# Patient Record
Sex: Male | Born: 1985 | Race: White | Hispanic: No | Marital: Single | Smoking: Former smoker
Health system: Southern US, Community
[De-identification: ages and names within clinical notes are randomized; demographics above are authoritative.]

## PROBLEM LIST (undated history)

## (undated) DIAGNOSIS — I10 Essential (primary) hypertension: Secondary | ICD-10-CM

## (undated) HISTORY — DX: Essential (primary) hypertension: I10

## (undated) HISTORY — PX: TONSILLECTOMY: SUR1361

---

## 2015-11-16 ENCOUNTER — Encounter: Payer: Self-pay | Admitting: Family Medicine

## 2015-11-16 ENCOUNTER — Ambulatory Visit (INDEPENDENT_AMBULATORY_CARE_PROVIDER_SITE_OTHER): Payer: Worker's Compensation | Admitting: Family Medicine

## 2015-11-16 VITALS — BP 121/78 | HR 72 | Temp 98.1°F | Ht 68.0 in | Wt 262.4 lb

## 2015-11-16 DIAGNOSIS — S239XXA Sprain of unspecified parts of thorax, initial encounter: Secondary | ICD-10-CM | POA: Diagnosis not present

## 2015-11-16 MED ORDER — CYCLOBENZAPRINE HCL 10 MG PO TABS: 10.0000 mg | ORAL_TABLET | Freq: Three times a day (TID) | ORAL | Status: DC | PRN

## 2015-11-16 NOTE — Progress Notes (Signed)
BP 121/78 mmHg  Pulse 72  Temp(Src) 98.1 F (36.7 C) (Oral)  Ht  (1.727 m)  Wt 262 lb 6.4 oz (119.024 kg)  BMI 39.91 kg/m2   Subjective:    Patient ID: Christian Weaver, male    DOB: February 25, 1986, 30 y.o.   MRN: [id number]  HPI: Christian Weaver is a 30 y.o. male presenting on 11/16/2015 for Back Pain   HPI Back pain/Worker's Comp. Patient is coming in today for left-sided back pain/Worker's Comp. visit. He works for [employer] ([employer]). Yesterday he was pulling as a belt and the way he was pulling made him twist very quickly when released and now he is having left-sided back pain is extending down from just under his scapula to the lower back that side. He also has a little bit in the mid thoracic on the right side as well. He denies any tingling or numbness or weakness going down either side. He does have a lot of feelings of being uncomfortable and can't lay for prolonged periods and gets really stiff. He has been using anti-inflammatories and Tylenol to try and help. His also been icing to try and help as well. Is also been trying to do some stretching initially at home to try and help as well. The injury occurred on 11/15/2015.  Relevant past medical, surgical, family and social history reviewed and updated as indicated. Interim medical history since our last visit reviewed. Allergies and medications reviewed and updated.  Review of Systems  Constitutional: Negative for fever.  HENT: Negative for ear discharge and ear pain.   Eyes: Negative for discharge and visual disturbance.  Respiratory: Negative for shortness of breath and wheezing.   Cardiovascular: Negative for chest pain and leg swelling.  Gastrointestinal: Negative for abdominal pain, diarrhea and constipation.  Genitourinary: Negative for difficulty urinating.  Musculoskeletal: Positive for myalgias and back pain. Negative for gait problem.  Skin: Negative for rash.  Neurological: Negative for syncope, light-headedness and  headaches.  All other systems reviewed and are negative.   Per HPI unless specifically indicated above     Medication List       This list is accurate as of: 11/16/15  4:34 PM.  Always use your most recent med list.               acetaminophen 500 MG tablet  Commonly known as:  TYLENOL  Take 500 mg by mouth every 6 (six) hours as needed.     atenolol 25 MG tablet  Commonly known as:  TENORMIN  Take 25 mg by mouth daily.     cyclobenzaprine 10 MG tablet  Commonly known as:  FLEXERIL  Take 1 tablet (10 mg total) by mouth 3 (three) times daily as needed for muscle spasms.     naproxen sodium 220 MG tablet  Commonly known as:  ANAPROX  Take 220 mg by mouth as needed.           Objective:    BP 121/78 mmHg  Pulse 72  Temp(Src) 98.1 F (36.7 C) (Oral)  Ht  (1.727 m)  Wt 262 lb 6.4 oz (119.024 kg)  BMI 39.91 kg/m2  Wt Readings from Last 3 Encounters:  11/16/15 262 lb 6.4 oz (119.024 kg)    Physical Exam  Constitutional: He is oriented to person, place, and time. He appears well-developed and well-nourished. No distress.  Eyes: Conjunctivae and EOM are normal. Pupils are equal, round, and reactive to light. Right eye exhibits no discharge. No scleral  icterus.  Neck: Neck supple. No thyromegaly present.  Cardiovascular: Normal rate, regular rhythm, normal heart sounds and intact distal pulses.   No murmur heard. Pulmonary/Chest: Effort normal and breath sounds normal. No respiratory distress. He has no wheezes.  Musculoskeletal: Normal range of motion. He exhibits no edema.       Thoracic back: He exhibits tenderness. He exhibits normal range of motion, no bony tenderness, no swelling, no edema, no deformity and normal pulse.       Back:  Lymphadenopathy:    He has no cervical adenopathy.  Neurological: He is alert and oriented to person, place, and time. Coordination normal.  Skin: Skin is warm and dry. No rash noted. He is not diaphoretic.  Psychiatric: He  has a normal mood and affect. His behavior is normal.  Nursing note and vitals reviewed.   No results found for this or any previous visit.    Assessment & Plan:   Problem List Items Addressed This Visit    None    Visit Diagnoses    Back sprain, initial encounter    -  Primary    Left-sided back sprain while pulling something at work. Recommend heat, stretching, massage, muscle relaxers    Relevant Medications    cyclobenzaprine (FLEXERIL) 10 MG tablet        Follow up plan: Return if symptoms worsen or fail to improve.  Counseling provided for all of the vaccine components No orders of the defined types were placed in this encounter.    Arville CareJoshua Dettinger, MD [facility] 11/16/2015, 4:34 PM

## 2015-11-21 ENCOUNTER — Ambulatory Visit (INDEPENDENT_AMBULATORY_CARE_PROVIDER_SITE_OTHER): Payer: Worker's Compensation | Admitting: Family Medicine

## 2015-11-21 ENCOUNTER — Encounter: Payer: Self-pay | Admitting: Family Medicine

## 2015-11-21 ENCOUNTER — Telehealth: Payer: Self-pay

## 2015-11-21 ENCOUNTER — Ambulatory Visit (INDEPENDENT_AMBULATORY_CARE_PROVIDER_SITE_OTHER): Payer: Worker's Compensation

## 2015-11-21 VITALS — BP 127/56 | HR 63 | Temp 97.8°F | Ht 68.0 in | Wt 257.6 lb

## 2015-11-21 DIAGNOSIS — S239XXD Sprain of unspecified parts of thorax, subsequent encounter: Secondary | ICD-10-CM

## 2015-11-21 MED ORDER — TIZANIDINE HCL 4 MG PO CAPS: 4.0000 mg | ORAL_CAPSULE | Freq: Three times a day (TID) | ORAL | Status: DC

## 2015-11-21 MED ORDER — TRAMADOL HCL 50 MG PO TABS: 50.0000 mg | ORAL_TABLET | Freq: Three times a day (TID) | ORAL | Status: DC | PRN

## 2015-11-21 MED ORDER — METHYLPREDNISOLONE ACETATE 80 MG/ML IJ SUSP
80.0000 mg | Freq: Once | INTRAMUSCULAR | Status: AC
  Administered 2015-11-21: 80 mg via INTRAMUSCULAR

## 2015-11-21 NOTE — Telephone Encounter (Signed)
FYI, patient was prescribed Tizanidine capsules and they are $75 versus the Tizanidine tablets which are $14.   Patient does not have his workers [employer] insurance information so he is Cytogeneticistpaying cash.  [facility] to change from capsules to tablets.  I gave her authorization to do this and told her I would let you know.

## 2015-11-21 NOTE — Telephone Encounter (Signed)
Agree with Rx change.   Murtis SinkSam Romulus Hanrahan, MD [facility] 11/21/2015, 1:28 PM

## 2015-11-21 NOTE — Progress Notes (Addendum)
HPI  Patient presents today here for follow-up back pain, Worker's Compensation claim.  Patient was injured at work on June 15, last Thursday. He states that he had an anal whenever he pulled across his chest a heavy item, then he had to push it back when his right foot slipped causing a twisting motion of his back. He had immediate pain.  He went back to work the following day and had too much pain to continue. He was seen here and treated using NSAIDs and Flexeril. He states he's been using these medications, topical creams, ice, heat, and gentle stretching with not much improvement. He actually feels that he is getting a little bit worse.  He has been sent home due to appearing to be in a lot of pain.   PMH: Smoking status noted ROS: Per HPI  Objective: BP 127/56 mmHg  Pulse 63  Temp(Src) 97.8 F (36.6 C) (Oral)  Ht  (1.727 m)  Wt 257 lb 9.6 oz (116.847 kg)  BMI 39.18 kg/m2 Gen: NAD, alert, cooperative with exam HEENT: NCAT CV: RRR, good S1/S2, no murmur Resp: CTABL, no wheezes, non-labored Ext: No edema, warm Neuro: Alert and oriented, No gross deficits  Plain film of the thoracic and lumbar spine WNL   Patient smells like Biofreeze  MSK: Left-sided paraspinal muscle tenderness from his scapula to the left buttock, no midline tenderness Range of motion is preserved, has normal flexion, extension and rotation to either side  Assessment and plan:  # Back sprain, muscle spasm Consistent with back sprain Given significant symptoms that are severely limiting his function I have done some x-rays to rule out any bony abnormality. Given IM Depo-Medrol today, not getting much relief with NSAIDs Continue NSAIDs as needed, reviewed appropriate dosing of Aleve. Change muscle relaxer from Flexeril to tizanidine, too much sedation with Flexeril Given some limitations at work including no more than 5 pounds lifting or pulling, no bending Small amount of tramadol for  uncontrolled pain Physical therapy Return to clinic in 1 week    Orders Placed This Encounter  Procedures  . DG Thoracic Spine 2 View    Standing Status: Future     Number of Occurrences:      Standing Expiration Date: 01/20/2017    Order Specific Question:  Reason for Exam (SYMPTOM  OR DIAGNOSIS REQUIRED)    Answer:  back pain    Order Specific Question:  Preferred imaging location?    Answer:  Internal  . DG Lumbar Spine 2-3 Views    Standing Status: Future     Number of Occurrences:      Standing Expiration Date: 11/20/2016    Order Specific Question:  Reason for Exam (SYMPTOM  OR DIAGNOSIS REQUIRED)    Answer:  back pain    Order Specific Question:  Preferred imaging location?    Answer:  Internal  . Ambulatory referral to Physical Therapy    Referral Priority:  Routine    Referral Type:  Physical Medicine    Referral Reason:  Specialty Services Required    Requested Specialty:  Physical Therapy    Number of Visits Requested:  1    Meds ordered this encounter  Medications  . tizanidine (ZANAFLEX) 4 MG capsule    Sig: Take 1 capsule (4 mg total) by mouth 3 (three) times daily.    Dispense:  30 capsule    Refill:  0  . traMADol (ULTRAM) 50 MG tablet    Sig: Take 1 tablet (  50 mg total) by mouth every 8 (eight) hours as needed.    Dispense:  30 tablet    Refill:  0    Christian SinkSam Kentley Blyden, MD [facility] 11/21/2015, 11:30 AM

## 2015-11-21 NOTE — Patient Instructions (Signed)
Great to see you!  I have sent tizanidine, a muscle relaxer for your muscle spasm  You can try tramadol for the worse pain, continue aleve as needed. Do not drive or work after taking tramadol  You can contact physical therapy to schedule an appointment right away

## 2015-11-30 ENCOUNTER — Ambulatory Visit (INDEPENDENT_AMBULATORY_CARE_PROVIDER_SITE_OTHER): Payer: Worker's Compensation | Admitting: Family Medicine

## 2015-11-30 ENCOUNTER — Encounter: Payer: Self-pay | Admitting: Family Medicine

## 2015-11-30 VITALS — BP 138/78 | HR 78 | Temp 97.9°F | Ht 68.0 in | Wt 264.6 lb

## 2015-11-30 DIAGNOSIS — S39012D Strain of muscle, fascia and tendon of lower back, subsequent encounter: Secondary | ICD-10-CM

## 2015-11-30 NOTE — Progress Notes (Signed)
   HPI  Patient presents today here for follow-up of back strain, Worker's Compensation  Patient states that her work she's been avoiding lifting and pulling but otherwise working light normal. He feels ready to go back to work.  His back pain is improved drastically but not completely better.  He is taking Flexeril at night as needed tizanidine during the day. Tramadol did not help.  He denies any leg weakness, numbness, tingling. He also denies any radiation to the legs.   PMH: Smoking status noted ROS: Per HPI  Objective: BP 138/78 mmHg  Pulse 78  Temp(Src) 97.9 F (36.6 C) (Oral)  Ht 5\' 8"  (1.727 m)  Wt 264 lb 9.6 oz (120.022 kg)  BMI 40.24 kg/m2 Gen: NAD, alert, cooperative with exam HEENT: NCAT CV: RRR, good S1/S2, no murmur Resp: CTABL, no wheezes, non-labored Ext: No edema, warm Neuro: Alert and oriented, No gross deficits  Musculoskeletal Tenderness to palpation of lumbar spine, and paraspinal muscles on both sides Full range of motion with back  Assessment and plan:  # Back strain Improving, patient feels ready to get back to work now without any restrictions, he does have some residual pain. He is out of work next week for the holiday. Return to clinic with any concerns    Murtis SinkSam Bradshaw, MD [facility] 11/30/2015, 4:18 PM

## 2015-12-16 ENCOUNTER — Encounter (HOSPITAL_COMMUNITY): Payer: Self-pay | Admitting: Emergency Medicine

## 2015-12-16 ENCOUNTER — Emergency Department (HOSPITAL_COMMUNITY)
Admission: EM | Admit: 2015-12-16 | Discharge: 2015-12-16 | Disposition: A | Discharge status: Home / Self Care | Payer: BLUE CROSS/BLUE SHIELD | Attending: Emergency Medicine | Admitting: Emergency Medicine

## 2015-12-16 ENCOUNTER — Emergency Department (HOSPITAL_COMMUNITY): Payer: BLUE CROSS/BLUE SHIELD

## 2015-12-16 DIAGNOSIS — Y929 Unspecified place or not applicable: Secondary | ICD-10-CM | POA: Diagnosis not present

## 2015-12-16 DIAGNOSIS — Z87891 Personal history of nicotine dependence: Secondary | ICD-10-CM | POA: Diagnosis not present

## 2015-12-16 DIAGNOSIS — W010XXA Fall on same level from slipping, tripping and stumbling without subsequent striking against object, initial encounter: Secondary | ICD-10-CM | POA: Diagnosis not present

## 2015-12-16 DIAGNOSIS — Y939 Activity, unspecified: Secondary | ICD-10-CM | POA: Diagnosis not present

## 2015-12-16 DIAGNOSIS — S62308A Unspecified fracture of other metacarpal bone, initial encounter for closed fracture: Secondary | ICD-10-CM

## 2015-12-16 DIAGNOSIS — S62322A Displaced fracture of shaft of third metacarpal bone, right hand, initial encounter for closed fracture: Secondary | ICD-10-CM | POA: Insufficient documentation

## 2015-12-16 DIAGNOSIS — I10 Essential (primary) hypertension: Secondary | ICD-10-CM | POA: Diagnosis not present

## 2015-12-16 DIAGNOSIS — Y999 Unspecified external cause status: Secondary | ICD-10-CM | POA: Diagnosis not present

## 2015-12-16 DIAGNOSIS — S6991XA Unspecified injury of right wrist, hand and finger(s), initial encounter: Secondary | ICD-10-CM | POA: Diagnosis present

## 2015-12-16 DIAGNOSIS — Z79899 Other long term (current) drug therapy: Secondary | ICD-10-CM | POA: Insufficient documentation

## 2015-12-16 MED ORDER — NAPROXEN 250 MG PO TABS: 250.0000 mg | ORAL_TABLET | Freq: Two times a day (BID) | ORAL | Status: AC | PRN

## 2015-12-16 NOTE — Discharge Instructions (Signed)
Take the prescription as directed.  Call the Orthopedic doctor on Monday to schedule a follow up appointment this week.  Return to the Emergency Department immediately sooner if worsening.

## 2015-12-16 NOTE — ED Provider Notes (Signed)
CSN: [id number]     Arrival date & time 12/16/15  4098 History   First MD Initiated Contact with Patient 12/16/15 [phone]     Chief Complaint  Patient presents with  . Hand Injury      HPI Pt was seen at 0720. Per pt, c/o sudden onset and resolution of one episode of fall that occurred approximately PTA. Pt states he tripped and fell, landing on his right hand. Pt c/o right hand and wrist "pain." Pain worsens with palpation of the area and movement. Denies any other injuries. Denies focal motor weakness, no tingling/numbness in extremities.    Past Medical History  Diagnosis Date  . Hypertension    Past Surgical History  Procedure Laterality Date  . Tonsillectomy     Family History  Problem Relation Age of Onset  . Drug abuse Paternal Aunt    Social History  Substance Use Topics  . Smoking status: Former Smoker    Quit date: 06/02/2014  . Smokeless tobacco: None  . Alcohol Use: Yes     Comment: occasional    Review of Systems ROS: Statement: All systems negative except as marked or noted in the HPI; Constitutional: Negative for fever and chills. ; ; Eyes: Negative for eye pain, redness and discharge. ; ; ENMT: Negative for ear pain, hoarseness, nasal congestion, sinus pressure and sore throat. ; ; Cardiovascular: Negative for chest pain, palpitations, diaphoresis, dyspnea and peripheral edema. ; ; Respiratory: Negative for cough, wheezing and stridor. ; ; Gastrointestinal: Negative for nausea, vomiting, diarrhea, abdominal pain, blood in stool, hematemesis, jaundice and rectal bleeding. . ; ; Genitourinary: Negative for dysuria, flank pain and hematuria. ; ; Musculoskeletal: Negative for back pain and neck pain. +right hand and wrist pain, swelling and trauma.; ; Skin: Negative for pruritus, rash, abrasions, blisters, bruising and skin lesion.; ; Neuro: Negative for headache, lightheadedness and neck stiffness. Negative for weakness, altered level of consciousness, altered mental  status, extremity weakness, paresthesias, involuntary movement, seizure and syncope.      Allergies  Review of patient's allergies indicates no known allergies.  Home Medications   Prior to Admission medications   Medication Sig Start Date End Date Taking? Authorizing Provider  acetaminophen (TYLENOL) 500 MG tablet Take 500 mg by mouth every 6 (six) hours as needed.    Historical Provider, MD  atenolol (TENORMIN) 25 MG tablet Take 25 mg by mouth daily.    Historical Provider, MD  naproxen sodium (ANAPROX) 220 MG tablet Take 220 mg by mouth as needed.    Historical Provider, MD  tizanidine (ZANAFLEX) 4 MG capsule Take 1 capsule (4 mg total) by mouth 3 (three) times daily. 11/21/15   Elenora Gamma, MD  traMADol (ULTRAM) 50 MG tablet Take 1 tablet (50 mg total) by mouth every 8 (eight) hours as needed. 11/21/15   Elenora Gamma, MD   BP 146/101 mmHg  Pulse 117  Temp(Src) 98.2 F (36.8 C) (Oral)  Resp 20  Ht  (1.727 m)  Wt 248 lb (112.492 kg)  BMI 37.72 kg/m2  SpO2 94% Physical Exam  0725: Physical examination:  Nursing notes reviewed; Vital signs and O2 SAT reviewed;  Constitutional: Well developed, Well nourished, Well hydrated, In no acute distress; Head:  Normocephalic, atraumatic; Eyes: EOMI, PERRL, No scleral icterus; ENMT: Mouth and pharynx normal, Mucous membranes moist; Neck: Supple, Full range of motion; Cardiovascular: Regular rate and rhythm, No gallop; Respiratory: Breath sounds clear & equal bilaterally, No wheezes. Speaking full sentences with  ease, Normal respiratory effort/excursion; Chest: Nontender, Movement normal; Abdomen: Nondistended; Extremities: Pulses normal, NT right shoulder/elbow/finger. +generalized TTP right wrist, no edema, no deformity, no ecchymosis  +TTP right metacarpals 2-5 with localized edema, ecchymosis, no open wounds, no abrasions, no deformity. Right forearm compartments soft, strong radial pp, brisk cap refill in fingers. Hand NMS intact.  .; Neuro: AA&Ox3, Major CN grossly intact.  Speech clear. No gross focal motor or sensory deficits in extremities. Climbs on and off stretcher easily by himself. Gait steady.; Skin: Color normal, Warm, Dry.   ED Course  Procedures (including critical care time) Labs Review  Imaging Review  I have personally reviewed and evaluated these images and lab results as part of my medical decision-making.   EKG Interpretation None      MDM  MDM Reviewed: previous chart, nursing note and vitals Interpretation: x-ray     Dg Wrist Complete Right 12/16/2015  CLINICAL DATA:  Right wrist pain after fall today. Initial encounter. EXAM: RIGHT WRIST - COMPLETE 3+ VIEW COMPARISON:  None. FINDINGS: Mildly displaced fracture is seen involving the distal portion of the third metacarpal. This appears to be closed and posttraumatic. No other fracture or bony abnormality is noted. Joint spaces are intact. No soft tissue abnormality is noted. IMPRESSION: Mildly displaced distal third metacarpal fracture. Electronically Signed   By: Lupita RaiderJames  Green Jr, M.D.   On: 12/16/2015 07:50   Dg Hand Complete Right 12/16/2015  CLINICAL DATA:  Injury.  Pain and swelling EXAM: RIGHT HAND - COMPLETE 3+ VIEW COMPARISON:  None. FINDINGS: There is an acute fracture deformity involving the distal shaft of the third metacarpal bone. There is mild volar angulation of the distal fracture fragments. Diffuse soft tissue swelling is identified. IMPRESSION: 1. Acute fracture involves the distal shaft of the third metacarpal bone. Electronically Signed   By: Signa Kellaylor  Stroud M.D.   On: 12/16/2015 07:26    0810:  XR as above. Splint, f/u Ortho MD. Dx and testing d/w pt.  Questions answered.  Verb understanding, agreeable to d/c home with outpt f/u.   Samuel JesterKathleen Levar Fayson, DO 12/19/15 1624

## 2015-12-16 NOTE — ED Notes (Signed)
Pt c/o right hand pain after tripping and falling on it. Swelling to right knuckles noted.

## 2015-12-18 ENCOUNTER — Encounter: Payer: Self-pay | Admitting: Orthopaedic Surgery

## 2015-12-18 ENCOUNTER — Ambulatory Visit (INDEPENDENT_AMBULATORY_CARE_PROVIDER_SITE_OTHER): Payer: BLUE CROSS/BLUE SHIELD | Admitting: Orthopaedic Surgery

## 2015-12-18 VITALS — BP 131/86 | HR 88 | Temp 98.1°F | Ht 66.5 in | Wt 262.4 lb

## 2015-12-18 DIAGNOSIS — S62309A Unspecified fracture of unspecified metacarpal bone, initial encounter for closed fracture: Secondary | ICD-10-CM

## 2015-12-18 MED ORDER — HYDROCODONE-ACETAMINOPHEN 7.5-325 MG PO TABS: ORAL_TABLET | ORAL | Status: DC

## 2015-12-18 NOTE — Progress Notes (Signed)
Subjective: I broke my right hand    Patient ID: Christian Weaver, male    DOB: 05/26/1986, 30 y.o.   MRN: [id number]  HPI He had an injury to the right dominant hand yesterday in a fall.  He was seen in the ER and x-rays show a fracture of the third metacarpal head/neck area essentially nondisplaced.  He has no other injury.  He was fitted with a splint and referred here.  He has pain controlled.  I have reviewed the x-rays and the report and the ER records.   Review of Systems  HENT: Negative for congestion.   Respiratory: Negative for cough and shortness of breath.   Endocrine: Negative for cold intolerance.  Musculoskeletal: Positive for joint swelling and arthralgias.  Allergic/Immunologic: Negative for environmental allergies.   Past Medical History  Diagnosis Date  . Hypertension     Past Surgical History  Procedure Laterality Date  . Tonsillectomy      Current Outpatient Prescriptions on File Prior to Visit  Medication Sig Dispense Refill  . acetaminophen (TYLENOL) 500 MG tablet Take 500 mg by mouth every 6 (six) hours as needed.    Marland Kitchen atenolol (TENORMIN) 25 MG tablet Take 25 mg by mouth daily.    . naproxen (NAPROSYN) 250 MG tablet Take 1 tablet (250 mg total) by mouth 2 (two) times daily as needed for mild pain or moderate pain (take with food). 14 tablet 0  . naproxen sodium (ANAPROX) 220 MG tablet Take 220 mg by mouth as needed.    . tizanidine (ZANAFLEX) 4 MG capsule Take 1 capsule (4 mg total) by mouth 3 (three) times daily. 30 capsule 0   No current facility-administered medications on file prior to visit.    Social History   Social History  . Marital Status: Single    Spouse Name: N/A  . Number of Children: N/A  . Years of Education: N/A   Occupational History  . Not on file.   Social History Main Topics  . Smoking status: Former Smoker    Quit date: 06/02/2014  . Smokeless tobacco: Not on file  . Alcohol Use: Yes     Comment: occasional  . Drug  Use: No  . Sexual Activity: Not on file   Other Topics Concern  . Not on file   Social History Narrative    Family History  Problem Relation Age of Onset  . Drug abuse Paternal Aunt     BP 131/86 mmHg  Pulse 88  Temp(Src) 98.1 F (36.7 C)  Ht 5' 6.5" (1.689 m)  Wt 262 lb 6.4 oz (119.024 kg)  BMI 41.72 kg/m2     Objective:   Physical Exam  Constitutional: He is oriented to person, place, and time. He appears well-developed and well-nourished.  HENT:  Head: Normocephalic and atraumatic.  Eyes: Conjunctivae and EOM are normal. Pupils are equal, round, and reactive to light.  Neck: Normal range of motion. Neck supple.  Cardiovascular: Normal rate, regular rhythm and intact distal pulses.   Pulmonary/Chest: Effort normal.  Abdominal: Soft.  Musculoskeletal: He exhibits tenderness (Right hand with tenderness and swelling over the third metacarpal head with normal alignment.  NV intact.   ROM decreased.  Left hand negative.).  Neurological: He is alert and oriented to person, place, and time. He has normal reflexes. No cranial nerve deficit. He exhibits normal muscle tone. Coordination normal.  Skin: Skin is warm and dry.  Psychiatric: He has a normal mood and affect. His behavior  is normal. Judgment and thought content normal.    He is placed in a short arm cast with dorsal outrigger for the long finger and buddy taping of the ring finger to the long finer on the right.      Assessment & Plan:   Encounter Diagnosis  Name Primary?  . Metacarpal bone fracture, closed, initial encounter Yes    Return in one week with x-rays then in the cast and splint.  Call if any problem.  Precautions discussed.  Electronically Signed Darreld McleanWayne Evola Hollis, MD 7/18/20179:50 PM

## 2015-12-25 ENCOUNTER — Encounter: Payer: Self-pay | Admitting: Orthopaedic Surgery

## 2015-12-25 ENCOUNTER — Ambulatory Visit: Payer: BLUE CROSS/BLUE SHIELD | Admitting: Orthopaedic Surgery

## 2015-12-25 ENCOUNTER — Ambulatory Visit (HOSPITAL_COMMUNITY)
Admission: RE | Admit: 2015-12-25 | Discharge: 2015-12-25 | Disposition: A | Discharge status: Home / Self Care | Payer: BLUE CROSS/BLUE SHIELD | Source: Ambulatory Visit | Attending: Orthopaedic Surgery | Admitting: Orthopaedic Surgery

## 2015-12-25 VITALS — BP 136/92 | HR 73 | Temp 97.7°F | Ht 66.5 in

## 2015-12-25 DIAGNOSIS — S62309D Unspecified fracture of unspecified metacarpal bone, subsequent encounter for fracture with routine healing: Secondary | ICD-10-CM | POA: Diagnosis not present

## 2015-12-25 DIAGNOSIS — X58XXXD Exposure to other specified factors, subsequent encounter: Secondary | ICD-10-CM | POA: Insufficient documentation

## 2015-12-25 NOTE — Patient Instructions (Signed)
Out of work 

## 2015-12-25 NOTE — Progress Notes (Signed)
CC:  My hand is not as tender  His right hand has less pain.  His cast and splint are in good condition.    NV is intact.    X-ray report states displaced fracture of the third metacarpal head.  He has slight volar tilt but I would not call it displaced.  I had Dr. Romeo Apple review film with me and he agrees with me.  I have told the patient this as well.  Encounter Diagnosis  Name Primary?  . Closed fracture of metacarpal bone, with routine healing, subsequent encounter Yes    Return in two weeks with x-rays out of the splint.  Consider [place] splint.  Call if any problem.  Precautions discussed.  Electronically Signed Darreld Mclean, MD 7/25/20178:09 PM

## 2016-01-08 ENCOUNTER — Ambulatory Visit (INDEPENDENT_AMBULATORY_CARE_PROVIDER_SITE_OTHER): Payer: BLUE CROSS/BLUE SHIELD

## 2016-01-08 ENCOUNTER — Encounter: Payer: Self-pay | Admitting: Orthopaedic Surgery

## 2016-01-08 ENCOUNTER — Ambulatory Visit (INDEPENDENT_AMBULATORY_CARE_PROVIDER_SITE_OTHER): Payer: BLUE CROSS/BLUE SHIELD | Admitting: Orthopaedic Surgery

## 2016-01-08 VITALS — BP 142/83 | HR 64 | Temp 97.9°F | Ht 66.5 in | Wt 262.0 lb

## 2016-01-08 DIAGNOSIS — S62309D Unspecified fracture of unspecified metacarpal bone, subsequent encounter for fracture with routine healing: Secondary | ICD-10-CM | POA: Diagnosis not present

## 2016-01-08 MED ORDER — HYDROCODONE-ACETAMINOPHEN 7.5-325 MG PO TABS: ORAL_TABLET | ORAL | 0 refills | Status: DC

## 2016-01-08 NOTE — Progress Notes (Signed)
CC:  My hand is sore some  He has been in the short arm cast with dorsal outrigger for the third metacarpal head fracture on the right.  He is doing well.  The cast is removed.  NV is intact.  Alignment is good.  He has some tenderness and some slight swelling of the dorsum of the right hand over the third metacarpal head area.  Motion is good first time out of the cast.  X-rays were done and reported separately.  Encounter Diagnosis  Name Primary?  . Closed fracture of metacarpal bone, with routine healing, subsequent encounter Yes   A [place] splint was applied.  Instructions for use given.  Return in two weeks.  X-rays on return.  Call if any problem.  Precautions discussed.  Electronically Signed Darreld McleanWayne Azalynn Maxim, MD 8/8/201711:08 AM

## 2016-01-22 ENCOUNTER — Ambulatory Visit (INDEPENDENT_AMBULATORY_CARE_PROVIDER_SITE_OTHER): Payer: BLUE CROSS/BLUE SHIELD

## 2016-01-22 ENCOUNTER — Encounter: Payer: Self-pay | Admitting: Orthopaedic Surgery

## 2016-01-22 ENCOUNTER — Ambulatory Visit (INDEPENDENT_AMBULATORY_CARE_PROVIDER_SITE_OTHER): Payer: BLUE CROSS/BLUE SHIELD | Admitting: Orthopaedic Surgery

## 2016-01-22 VITALS — BP 145/90 | HR 69 | Ht 66.5 in | Wt 266.0 lb

## 2016-01-22 DIAGNOSIS — S62309D Unspecified fracture of unspecified metacarpal bone, subsequent encounter for fracture with routine healing: Secondary | ICD-10-CM

## 2016-01-22 NOTE — Patient Instructions (Signed)
Return to work Monday 

## 2016-01-22 NOTE — Progress Notes (Signed)
CC:  My hand is better  He has been using the [place] splint on the right hand.  X-rays were done today. Fracture is healing.  NV intact.  He has no rotary changes. He lacks full flexion of the right long finger.  Encounter Diagnosis  Name Primary?  . Closed fracture of metacarpal bone, with routine healing, subsequent encounter Yes   Return to work Monday.  Return here in two weeks.  X-rays then.  Call if any problem  Precautions discussed.  Electronically Signed Darreld McleanWayne Tara Wich, MD 8/22/20179:29 AM

## 2016-02-05 ENCOUNTER — Encounter: Payer: Self-pay | Admitting: Orthopaedic Surgery

## 2016-02-05 ENCOUNTER — Ambulatory Visit (INDEPENDENT_AMBULATORY_CARE_PROVIDER_SITE_OTHER): Payer: BLUE CROSS/BLUE SHIELD | Admitting: Orthopaedic Surgery

## 2016-02-05 ENCOUNTER — Ambulatory Visit (INDEPENDENT_AMBULATORY_CARE_PROVIDER_SITE_OTHER): Payer: BLUE CROSS/BLUE SHIELD

## 2016-02-05 VITALS — BP 116/73 | HR 75 | Temp 97.7°F | Ht 67.0 in | Wt 257.0 lb

## 2016-02-05 DIAGNOSIS — S62309D Unspecified fracture of unspecified metacarpal bone, subsequent encounter for fracture with routine healing: Secondary | ICD-10-CM

## 2016-02-05 NOTE — Progress Notes (Signed)
CC:  My hand does not hurt at all  He has full ROM of the right hand and the long finger.  He has no pain.  NV intact.  Encounter Diagnosis  Name Primary?  . Closed fracture of metacarpal bone, with routine healing, subsequent encounter Yes   I will see him as needed.  Call if any problem.  Electronically Signed Darreld McleanWayne Gustie Bobb, MD 9/5/20178:37 AM

## 2016-12-23 ENCOUNTER — Ambulatory Visit (INDEPENDENT_AMBULATORY_CARE_PROVIDER_SITE_OTHER): Payer: Worker's Compensation

## 2016-12-23 ENCOUNTER — Ambulatory Visit (INDEPENDENT_AMBULATORY_CARE_PROVIDER_SITE_OTHER): Payer: Worker's Compensation | Admitting: Orthopaedic Surgery

## 2016-12-23 ENCOUNTER — Encounter (INDEPENDENT_AMBULATORY_CARE_PROVIDER_SITE_OTHER): Payer: Self-pay | Admitting: Orthopaedic Surgery

## 2016-12-23 DIAGNOSIS — M79642 Pain in left hand: Secondary | ICD-10-CM | POA: Diagnosis not present

## 2016-12-23 NOTE — Progress Notes (Signed)
   Office Visit Note   Patient: Christian Weaver           Date of Birth: 03/19/1986           MRN: [id number] Visit Date: 12/23/2016              Requested by: Christian Weaver, Christian Weaver [address] [place], [place] [address] PCP: Christian Weaver, Christian RadonJoshua A, Weaver   Assessment & Plan: Visit Diagnoses:  1. Pain in left hand     Plan: Left third and fourth digit crush injury. Recommend hand therapy for mobilization. NSAIDs as needed. Elevation and ice for swelling. Out of work for 2 weeks. Reevaluate in 2 weeks.  Follow-Up Instructions: Return in about 2 weeks (around 01/06/2017).   Orders:  Orders Placed This Encounter  Procedures  . XR Hand Complete Left   No orders of the defined types were placed in this encounter.     Procedures: No procedures performed   Clinical Data: No additional findings.   Subjective: Chief Complaint  Patient presents with  . Left Hand - Pain, Injury    Max is a 31 year old gentleman who sustained a crush injury to his third and fourth digit on the left hand yesterday in a hydraulic press. He comes in today for follow-up. He complains of swelling and throbbing pain. He also endorses numbness burning and tingling.    Review of Systems  Constitutional: Negative.   All other systems reviewed and are negative.    Objective: Vital Signs: There were no vitals taken for this visit.  Physical Exam  Constitutional: He is oriented to person, place, and time. He appears well-developed and well-nourished.  HENT:  Head: Normocephalic and atraumatic.  Eyes: Pupils are equal, round, and reactive to light.  Neck: Neck supple.  Pulmonary/Chest: Effort normal.  Abdominal: Soft.  Musculoskeletal: Normal range of motion.  Neurological: He is alert and oriented to person, place, and time.  Skin: Skin is warm.  Psychiatric: He has a normal mood and affect. His behavior is normal. Judgment and thought content normal.  Nursing note and vitals reviewed.   Ortho  Exam  Left hand exam in third and fourth digit exam show no focal flexor tendon or extensor tendon deficits. He is neurovascularly intact. Collateral ligaments are stable. Finger is warm well-perfused. Specialty Comments:  No specialty comments available.  Imaging: Xr Hand Complete Left  Result Date: 12/23/2016 No acute structural findings.    PMFS History: There are no active problems to display for this patient.  Past Medical History:  Diagnosis Date  . Hypertension     Family History  Problem Relation Age of Onset  . Drug abuse Paternal Aunt     Past Surgical History:  Procedure Laterality Date  . TONSILLECTOMY     Social History   Occupational History  . Not on file.   Social History Main Topics  . Smoking status: Former Smoker    Quit date: 06/02/2014  . Smokeless tobacco: Former NeurosurgeonUser  . Alcohol use Yes     Comment: occasional  . Drug use: No  . Sexual activity: Not on file

## 2016-12-29 ENCOUNTER — Telehealth (INDEPENDENT_AMBULATORY_CARE_PROVIDER_SITE_OTHER): Payer: Self-pay

## 2016-12-29 NOTE — Telephone Encounter (Signed)
Faxed the 12/24/16 office note to [employer] per their request

## 2017-01-01 ENCOUNTER — Other Ambulatory Visit (INDEPENDENT_AMBULATORY_CARE_PROVIDER_SITE_OTHER): Payer: Self-pay

## 2017-01-01 ENCOUNTER — Telehealth (INDEPENDENT_AMBULATORY_CARE_PROVIDER_SITE_OTHER): Payer: Self-pay

## 2017-01-01 DIAGNOSIS — M79642 Pain in left hand: Secondary | ICD-10-CM

## 2017-01-01 NOTE — Telephone Encounter (Signed)
Work comp is requesting a referral for occupational therapy for this patient. Fax to [phone]

## 2017-01-01 NOTE — Telephone Encounter (Signed)
Is this okay?

## 2017-01-01 NOTE — Telephone Encounter (Signed)
yes

## 2017-01-01 NOTE — Telephone Encounter (Signed)
Faxed to [phone]

## 2017-01-06 ENCOUNTER — Ambulatory Visit (INDEPENDENT_AMBULATORY_CARE_PROVIDER_SITE_OTHER): Payer: Worker's Compensation | Admitting: Orthopaedic Surgery

## 2017-01-06 DIAGNOSIS — S6722XD Crushing injury of left hand, subsequent encounter: Secondary | ICD-10-CM

## 2017-01-06 NOTE — Progress Notes (Signed)
   Office Visit Note   Patient: Christian Weaver           Date of Birth: 04/30/1986           MRN: [id number] Visit Date: 01/06/2017              Requested by: Dettinger, Elige RadonJoshua A, MD [address] [place], [place] [address] PCP: Dettinger, Elige RadonJoshua A, MD   Assessment & Plan: Visit Diagnoses:  1. Hand crush injury, left, subsequent encounter     Plan: Patient needs to continue to work with hand therapy for rehabilitation. Out of work for 4 weeks. Follow-up at that time fr recheck.  Follow-Up Instructions: Return in about 4 weeks (around 02/03/2017).   Orders:  No orders of the defined types were placed in this encounter.  No orders of the defined types were placed in this encounter.     Procedures: No procedures performed   Clinical Data: No additional findings.   Subjective: Chief Complaint  Patient presents with  . Left Hand - Pain    Max follows up today for his crush injury to his left hand. He is overall doing better. His range of motion has improved but still limited. He is doing hand therapy.    Review of Systems   Objective: Vital Signs: There were no vitals taken for this visit.  Physical Exam  Ortho Exam Left hand exam is improving. The swelling has improved. He still has limitation with range of motion of his fingers. Strength is still decreased. Sensation is intact. Specialty Comments:  No specialty comments available.  Imaging: No results found.   PMFS History: Patient Active Problem List   Diagnosis Date Noted  . Hand crush injury, left, subsequent encounter 01/06/2017   Past Medical History:  Diagnosis Date  . Hypertension     Family History  Problem Relation Age of Onset  . Drug abuse Paternal Aunt     Past Surgical History:  Procedure Laterality Date  . TONSILLECTOMY     Social History   Occupational History  . Not on file.   Social History Main Topics  . Smoking status: Former Smoker    Quit date: 06/02/2014  . Smokeless  tobacco: Former NeurosurgeonUser  . Alcohol use Yes     Comment: occasional  . Drug use: No  . Sexual activity: Not on file

## 2017-01-16 ENCOUNTER — Telehealth (INDEPENDENT_AMBULATORY_CARE_PROVIDER_SITE_OTHER): Payer: Self-pay

## 2017-01-16 NOTE — Telephone Encounter (Signed)
Faxed the 01/06/17 office and work note to adj per her request

## 2017-01-20 ENCOUNTER — Telehealth (INDEPENDENT_AMBULATORY_CARE_PROVIDER_SITE_OTHER): Payer: Self-pay

## 2017-01-20 NOTE — Telephone Encounter (Signed)
Faxed 01/06/17 to [employer] per their request

## 2017-02-05 ENCOUNTER — Telehealth (INDEPENDENT_AMBULATORY_CARE_PROVIDER_SITE_OTHER): Payer: Self-pay

## 2017-02-05 NOTE — Telephone Encounter (Signed)
Fax tomorrows office note to [employer] when ready Fax#[phone]

## 2017-02-06 ENCOUNTER — Ambulatory Visit (INDEPENDENT_AMBULATORY_CARE_PROVIDER_SITE_OTHER): Payer: Worker's Compensation | Admitting: Orthopaedic Surgery

## 2017-02-06 DIAGNOSIS — S6722XD Crushing injury of left hand, subsequent encounter: Secondary | ICD-10-CM | POA: Diagnosis not present

## 2017-02-06 NOTE — Progress Notes (Signed)
   Office Visit Note   Patient: Christian Weaver           Date of Birth: 03/18/1986           MRN: [id number] Visit Date: 02/06/2017              Requested by: Dettinger, Elige RadonJoshua A, MD [address] [place], [place] [id number] PCP: Dettinger, Elige RadonJoshua A, MD   Assessment & Plan: Visit Diagnoses:  1. Hand crush injury, left, subsequent encounter     Plan: Patient has recovered quite well from the injury. He may discharge from hand therapy at this time and return to work on Monday without restrictions. Work note provided. Questions encouraged and answered. I would expect the numbness to improve with time.  Follow-Up Instructions: Return if symptoms worsen or fail to improve.   Orders:  No orders of the defined types were placed in this encounter.  No orders of the defined types were placed in this encounter.     Procedures: No procedures performed   Clinical Data: No additional findings.   Subjective: Chief Complaint  Patient presents with  . Left Hand - Pain, Follow-up    Max follows up today for his hand injury. He is doing very well and is requesting to go back to work full time.    Review of Systems   Objective: Vital Signs: There were no vitals taken for this visit.  Physical Exam  Ortho Exam Left hand exam is essentially normal. He does endorse slight numbness in his ring finger. He has no motor deficits. Specialty Comments:  No specialty comments available.  Imaging: No results found.   PMFS History: Patient Active Problem List   Diagnosis Date Noted  . Hand crush injury, left, subsequent encounter 01/06/2017   Past Medical History:  Diagnosis Date  . Hypertension     Family History  Problem Relation Age of Onset  . Drug abuse Paternal Aunt     Past Surgical History:  Procedure Laterality Date  . TONSILLECTOMY     Social History   Occupational History  . Not on file.   Social History Main Topics  . Smoking status: Former Smoker    Quit  date: 06/02/2014  . Smokeless tobacco: Former NeurosurgeonUser  . Alcohol use Yes     Comment: occasional  . Drug use: No  . Sexual activity: Not on file

## 2017-02-10 NOTE — Telephone Encounter (Signed)
faxed

## 2017-12-06 IMAGING — DX DG LUMBAR SPINE 2-3V
2 series · 2 of 2 positions shown · non-contrast
Comparison: None.

CLINICAL DATA: Back pain

EXAM:
LUMBAR SPINE - 2-3 VIEW

[l-spine ap]
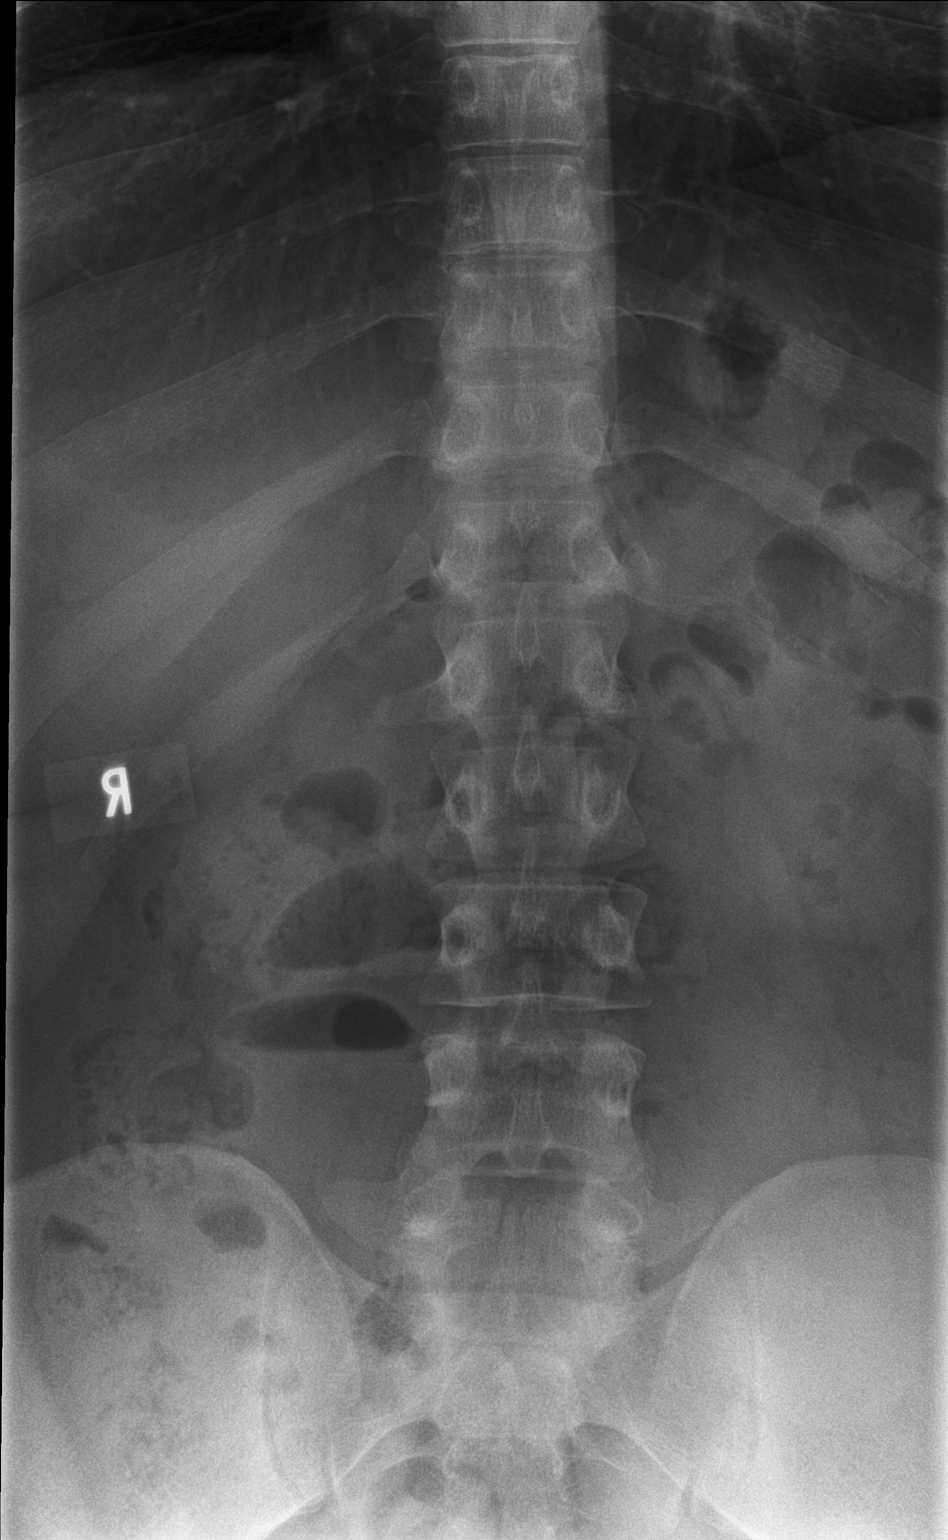

[l-spine lat]
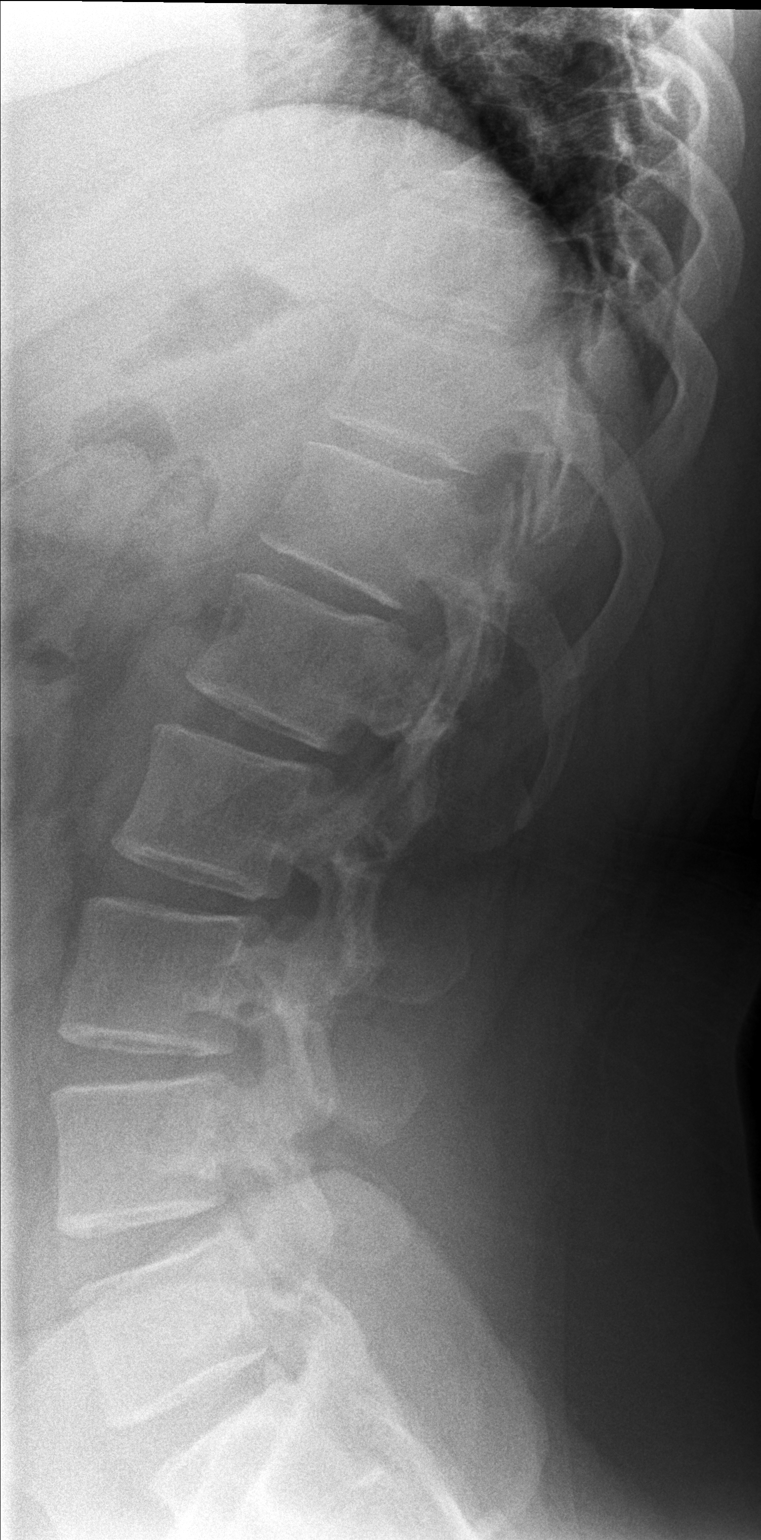

[2 of 2 positions shown; findings below may reference images not displayed]

FINDINGS: The lumbar vertebrae are in normal alignment. Intervertebral disc
spaces appear normal. No compression deformity is seen. No definite
pars defect is noted. The bowel gas pattern is nonspecific.
IMPRESSION: Normal alignment.  Normal intervertebral disc spaces.

## 2017-12-06 IMAGING — DX DG THORACIC SPINE 2V
2 series · 2 of 2 positions shown · non-contrast
Comparison: None.

CLINICAL DATA: Back pain

EXAM:
THORACIC SPINE 2 VIEWS

[t-spine ap]
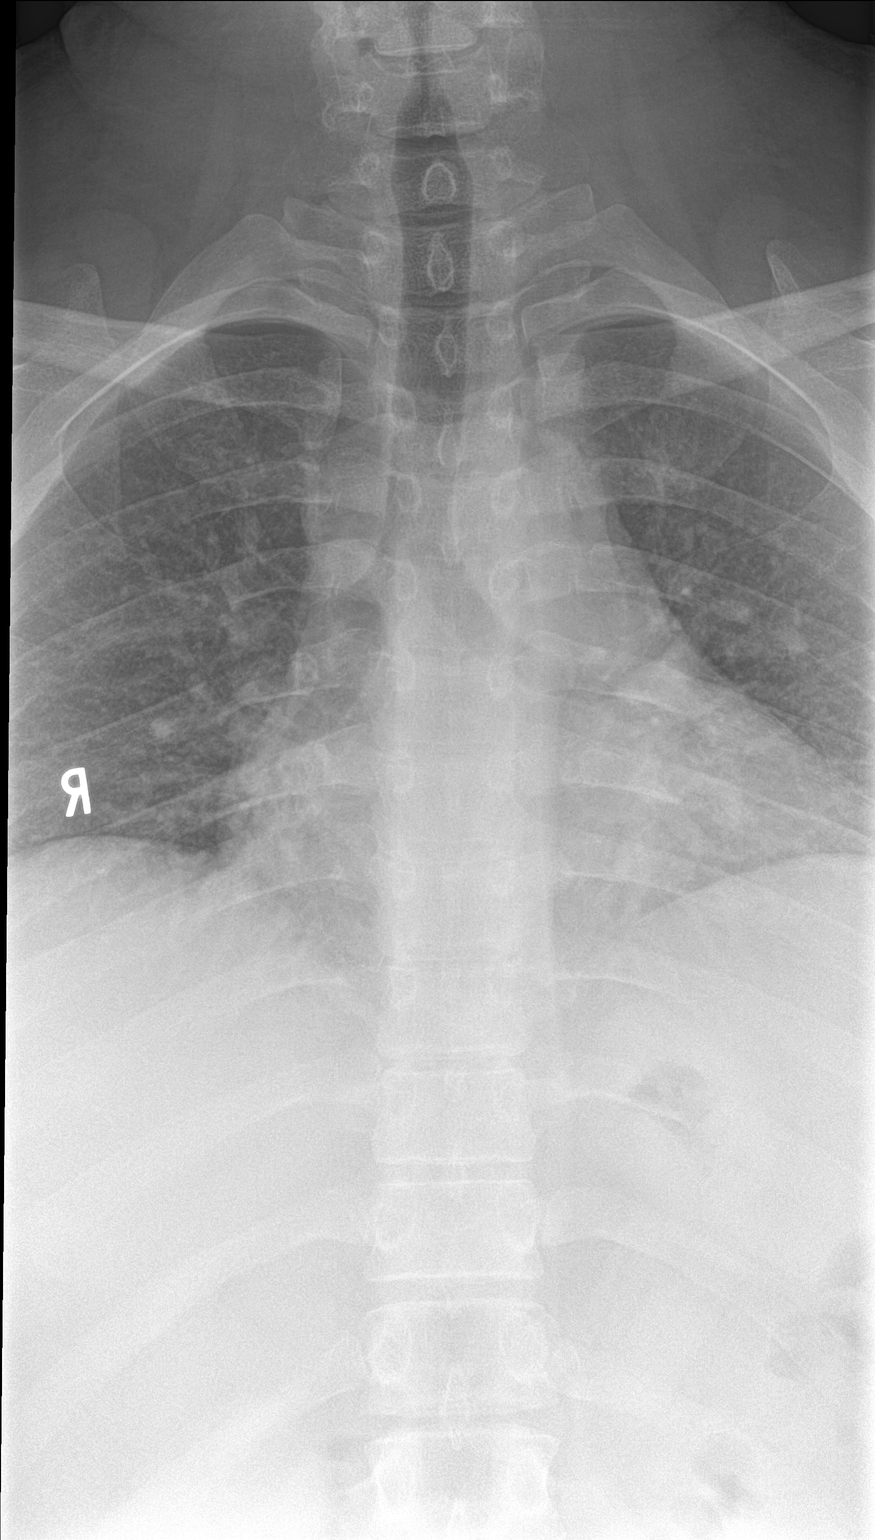

[t-spine lat]
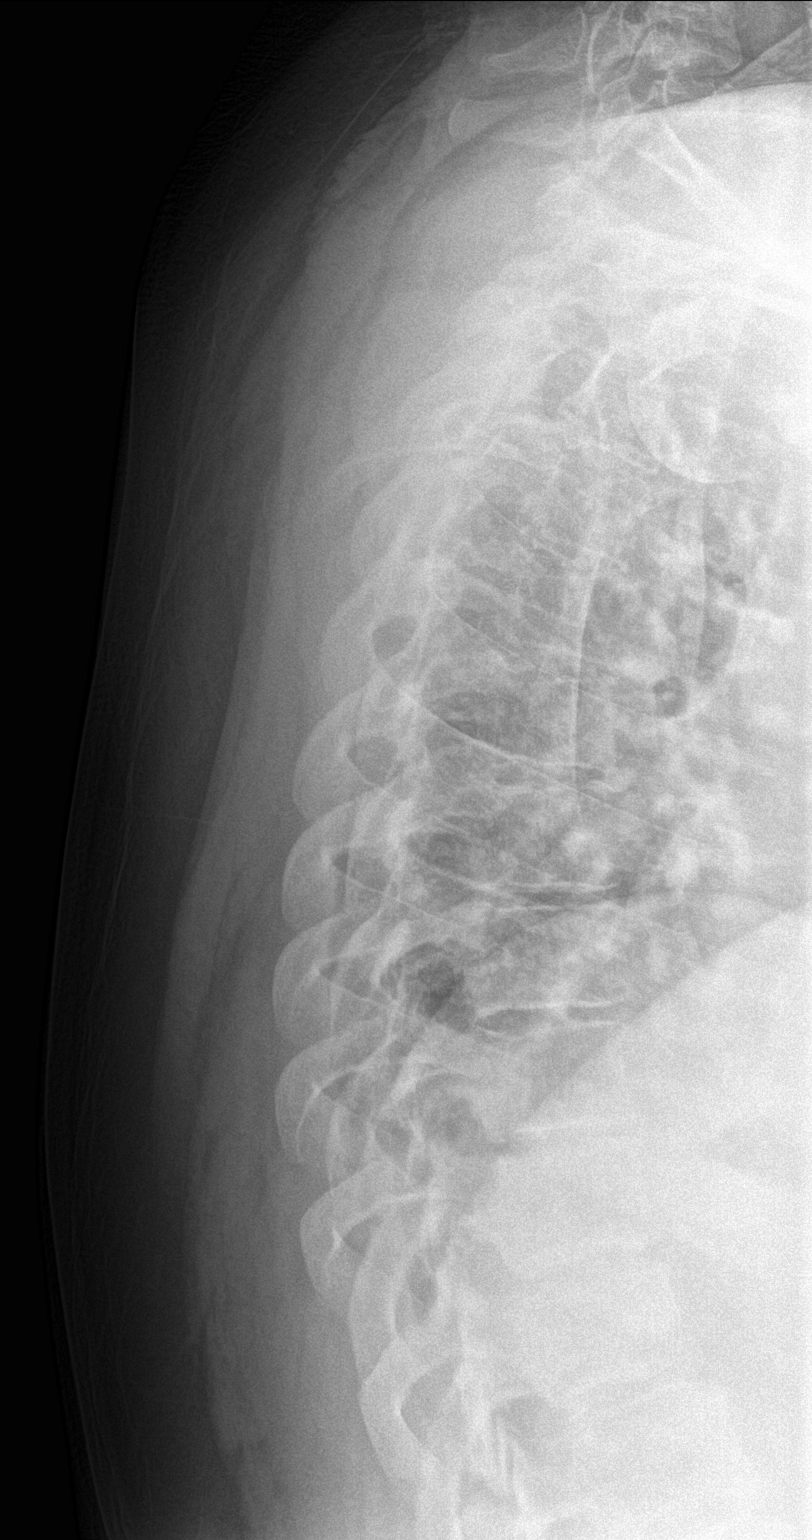

[2 of 2 positions shown; findings below may reference images not displayed]

FINDINGS: The thoracic vertebrae are in normal alignment. No compression
deformity is noted. Intervertebral disc spaces appear normal. No
prominent paravertebral soft tissue is seen.
IMPRESSION: Negative.

## 2018-10-13 ENCOUNTER — Other Ambulatory Visit: Payer: Self-pay | Admitting: *Deleted

## 2019-02-22 ENCOUNTER — Other Ambulatory Visit: Payer: Self-pay | Admitting: *Deleted

## 2019-02-22 DIAGNOSIS — Z20822 Contact with and (suspected) exposure to covid-19: Secondary | ICD-10-CM

## 2019-02-24 ENCOUNTER — Telehealth: Payer: Self-pay | Admitting: General Practice

## 2019-02-24 LAB — NOVEL CORONAVIRUS, NAA: SARS-CoV-2, NAA: NOT DETECTED

## 2019-02-24 NOTE — Telephone Encounter (Signed)
Negative COVID results given. Patient results "NOT Detected." Caller expressed understanding. ° °

## 2019-09-06 ENCOUNTER — Ambulatory Visit: Payer: Self-pay | Attending: Internal Medicine

## 2019-09-06 ENCOUNTER — Other Ambulatory Visit: Payer: Self-pay

## 2019-09-06 DIAGNOSIS — Z20822 Contact with and (suspected) exposure to covid-19: Secondary | ICD-10-CM | POA: Insufficient documentation

## 2019-09-07 LAB — SARS-COV-2, NAA 2 DAY TAT

## 2019-09-07 LAB — NOVEL CORONAVIRUS, NAA: SARS-CoV-2, NAA: NOT DETECTED

## 2021-07-05 ENCOUNTER — Other Ambulatory Visit (HOSPITAL_COMMUNITY): Payer: Self-pay | Admitting: Internal Medicine

## 2021-07-05 ENCOUNTER — Ambulatory Visit (HOSPITAL_COMMUNITY)
Admission: RE | Admit: 2021-07-05 | Discharge: 2021-07-05 | Disposition: A | Discharge status: Home / Self Care | Payer: BC Managed Care – PPO | Source: Ambulatory Visit | Attending: Internal Medicine | Admitting: Internal Medicine

## 2021-07-05 ENCOUNTER — Other Ambulatory Visit: Payer: Self-pay

## 2021-07-05 DIAGNOSIS — R059 Cough, unspecified: Secondary | ICD-10-CM | POA: Diagnosis present

## 2022-09-05 ENCOUNTER — Other Ambulatory Visit (HOSPITAL_COMMUNITY): Payer: Self-pay | Admitting: Internal Medicine

## 2022-09-05 ENCOUNTER — Ambulatory Visit (HOSPITAL_COMMUNITY)
Admission: RE | Admit: 2022-09-05 | Discharge: 2022-09-05 | Disposition: A | Discharge status: Home / Self Care | Payer: BC Managed Care – PPO | Source: Ambulatory Visit | Attending: Internal Medicine | Admitting: Internal Medicine

## 2022-09-05 DIAGNOSIS — T1490XA Injury, unspecified, initial encounter: Secondary | ICD-10-CM
